# Patient Record
Sex: Male | Born: 1975 | Race: Black or African American | Hispanic: No | Marital: Single | Smoking: Current every day smoker
Health system: Southern US, Community
[De-identification: ages and names within clinical notes are randomized; demographics above are authoritative.]

## PROBLEM LIST (undated history)

## (undated) DIAGNOSIS — I10 Essential (primary) hypertension: Secondary | ICD-10-CM

---

## 2005-03-30 ENCOUNTER — Ambulatory Visit: Payer: Self-pay | Admitting: Internal Medicine

## 2005-04-01 ENCOUNTER — Ambulatory Visit: Payer: Self-pay | Admitting: Internal Medicine

## 2005-04-02 ENCOUNTER — Ambulatory Visit (HOSPITAL_COMMUNITY): Admission: RE | Admit: 2005-04-02 | Discharge: 2005-04-02 | Payer: Self-pay | Admitting: Internal Medicine

## 2005-04-02 ENCOUNTER — Emergency Department (HOSPITAL_COMMUNITY): Admission: EM | Admit: 2005-04-02 | Discharge: 2005-04-02 | Payer: Self-pay | Admitting: Emergency Medicine

## 2005-04-07 ENCOUNTER — Ambulatory Visit: Payer: Self-pay | Admitting: *Deleted

## 2006-01-19 IMAGING — CR DG CERVICAL SPINE COMPLETE 4+V
5 series · 5 of 5 positions shown · non-contrast
Comparison: none

CLINICAL DATA: Neck pain for years.
 CERVICAL SPINE - 5 VIEW:

[w c-spine lat]
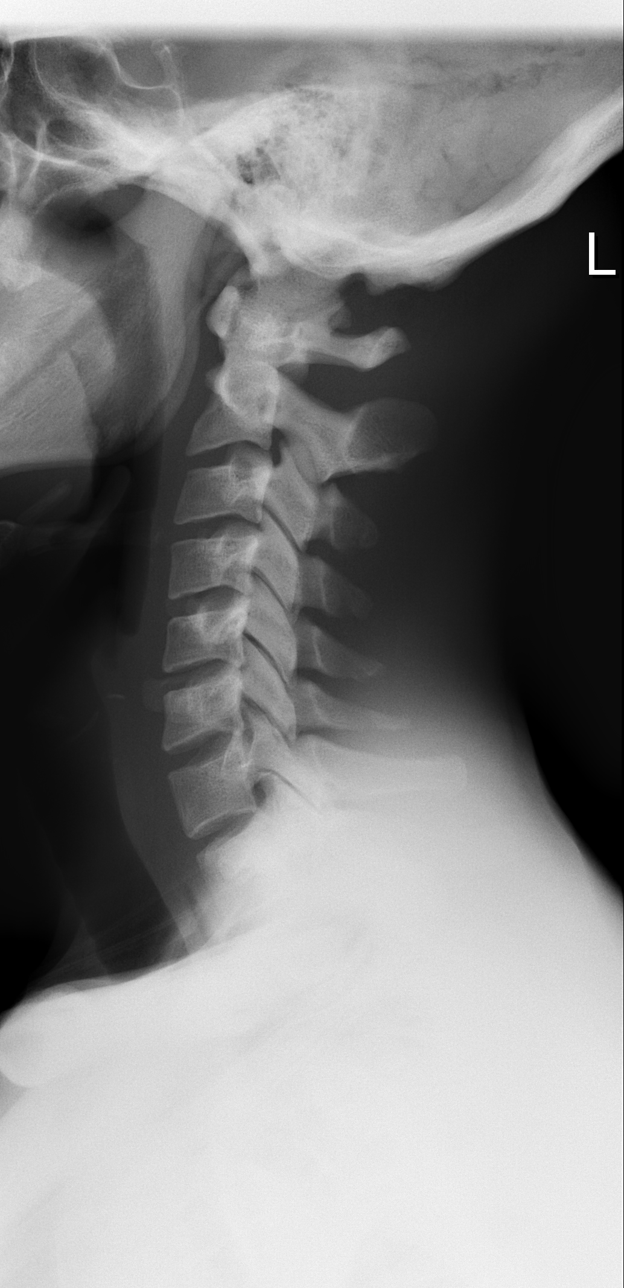

[w c-spine oblique (1 of 2)]
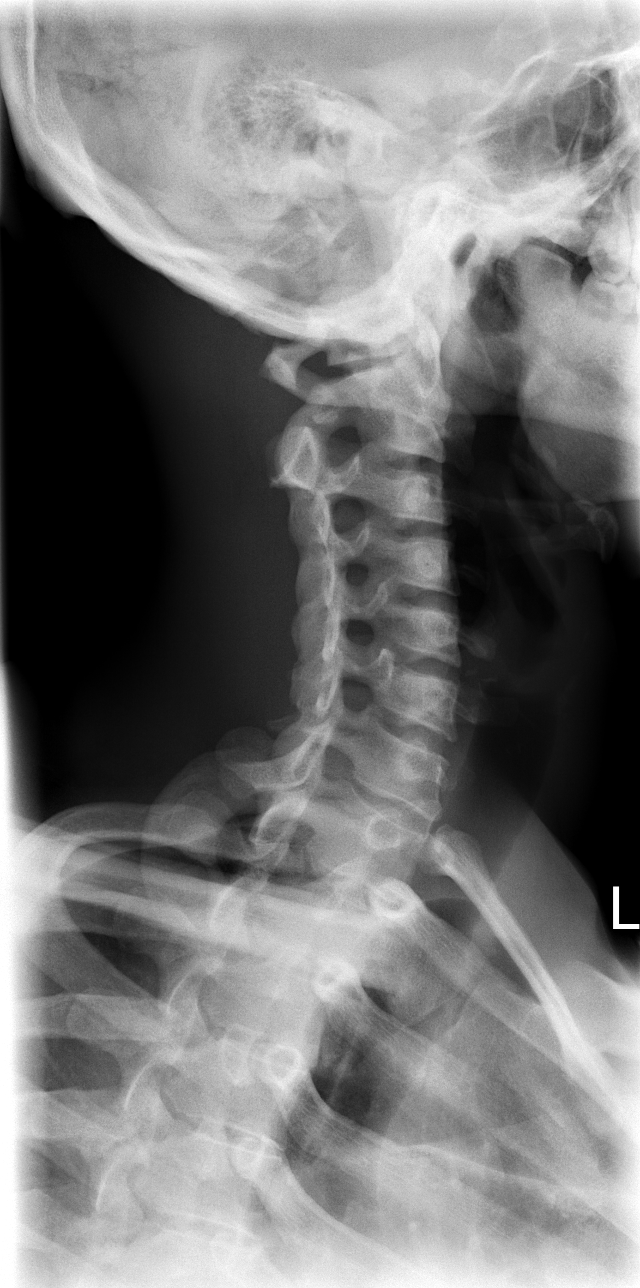

[w c-spine oblique (2 of 2)]
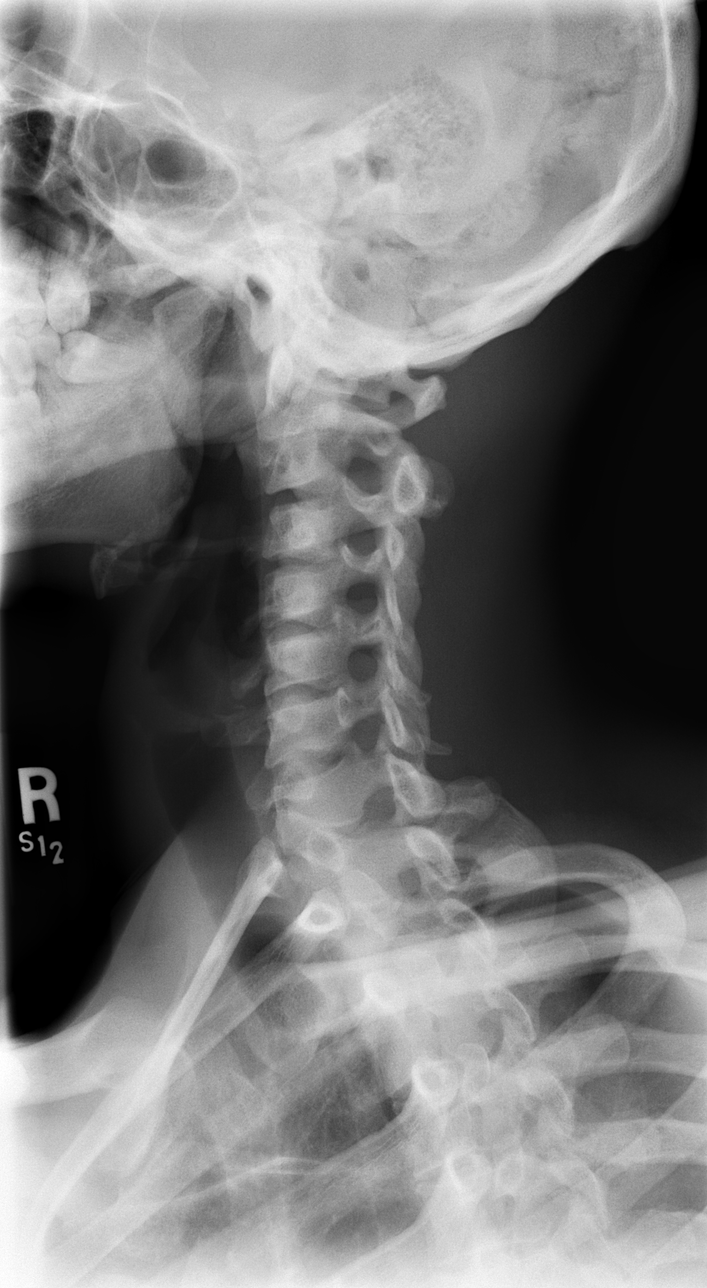

[w c-spine a.p.]
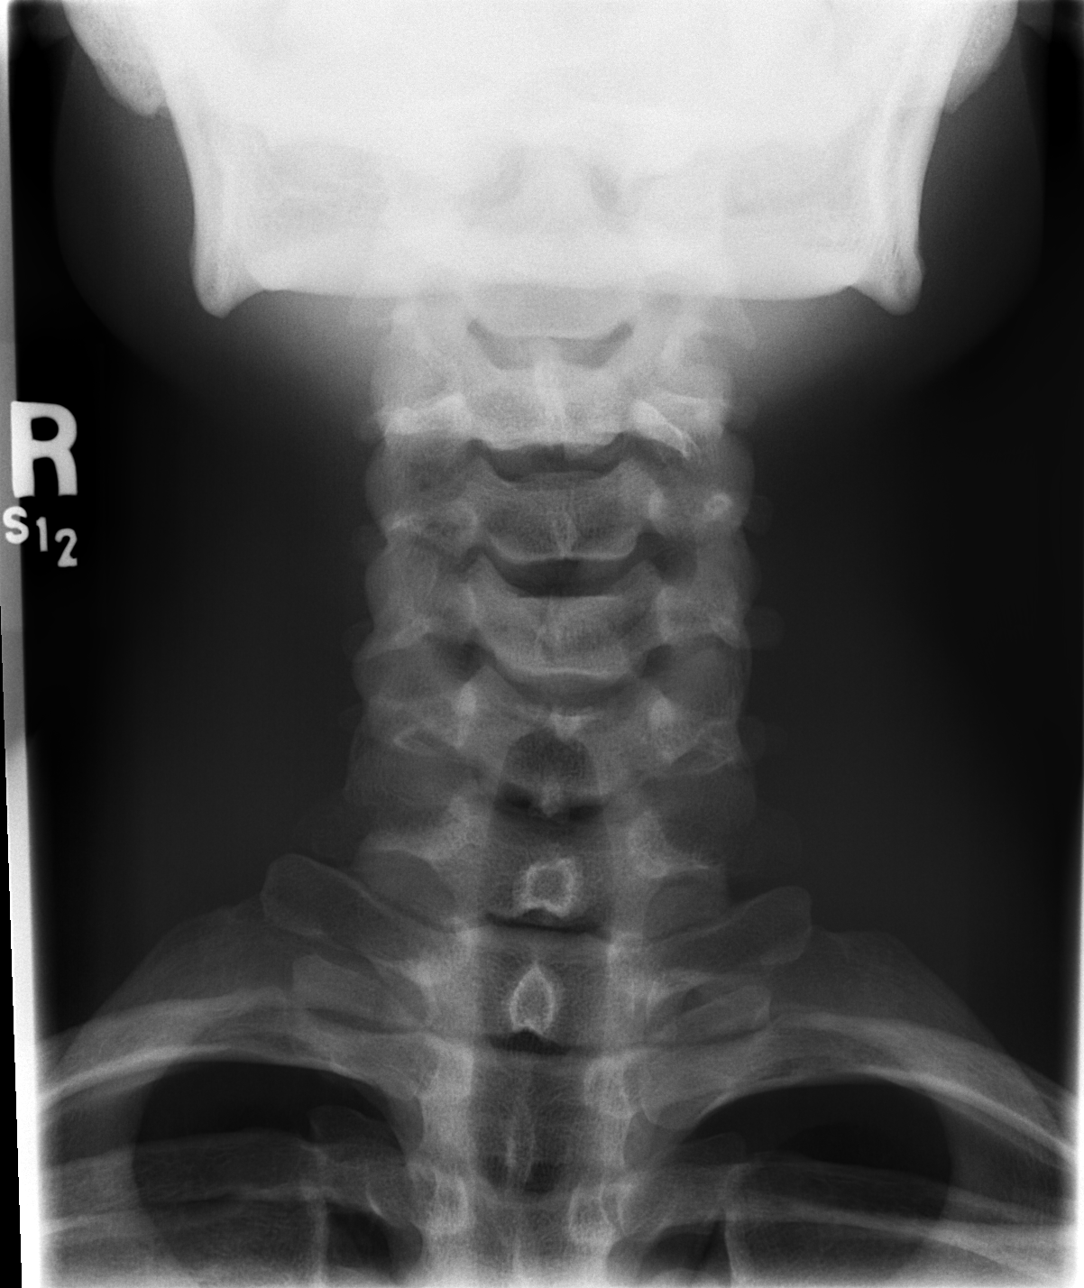

[w c-spine odontoid]
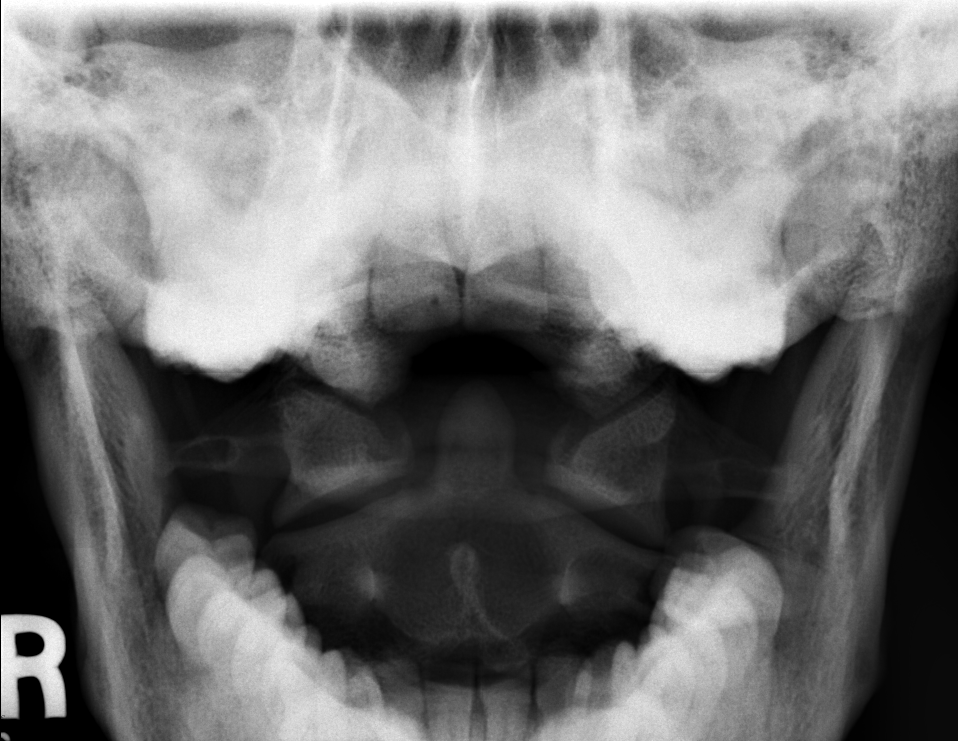

[5 of 5 positions shown; findings below may reference images not displayed]

FINDINGS: There is normal alignment.   No bony abnormality.   Neural foramen are widely patent.
IMPRESSION: No abnormality.

## 2006-10-21 ENCOUNTER — Inpatient Hospital Stay (HOSPITAL_COMMUNITY): Admission: EM | Admit: 2006-10-21 | Discharge: 2006-10-22 | Payer: Self-pay | Admitting: Emergency Medicine

## 2006-10-22 ENCOUNTER — Emergency Department (HOSPITAL_COMMUNITY): Admission: EM | Admit: 2006-10-22 | Discharge: 2006-10-23 | Payer: Self-pay | Admitting: Emergency Medicine
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
## 2021-09-29 ENCOUNTER — Other Ambulatory Visit: Payer: Self-pay

## 2022-02-20 ENCOUNTER — Encounter (HOSPITAL_COMMUNITY): Payer: Self-pay | Admitting: Emergency Medicine

## 2022-02-20 ENCOUNTER — Other Ambulatory Visit: Payer: Self-pay

## 2022-02-20 ENCOUNTER — Emergency Department (HOSPITAL_COMMUNITY)
Admission: EM | Admit: 2022-02-20 | Discharge: 2022-02-20 | Disposition: A | Discharge status: Home / Self Care | Attending: Emergency Medicine | Admitting: Emergency Medicine

## 2022-02-20 DIAGNOSIS — M545 Low back pain, unspecified: Secondary | ICD-10-CM | POA: Diagnosis present

## 2022-02-20 DIAGNOSIS — M25572 Pain in left ankle and joints of left foot: Secondary | ICD-10-CM | POA: Insufficient documentation

## 2022-02-20 DIAGNOSIS — M25571 Pain in right ankle and joints of right foot: Secondary | ICD-10-CM | POA: Diagnosis not present

## 2022-02-20 DIAGNOSIS — G8929 Other chronic pain: Secondary | ICD-10-CM

## 2022-02-20 DIAGNOSIS — M79671 Pain in right foot: Secondary | ICD-10-CM

## 2022-02-20 NOTE — ED Triage Notes (Signed)
BIBA Per EMS: pt did meth around 11am. Has not needed narcan at this time.  120/96 90 hR  12 RR 97% RA When pts dozes off he has decreased resp.
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
## 2022-02-20 NOTE — Discharge Instructions (Signed)
OK to book. Patient seen and evaluated at [facility] Emergency Dept.

## 2022-03-24 ENCOUNTER — Emergency Department (HOSPITAL_COMMUNITY)
Admission: EM | Admit: 2022-03-24 | Discharge: 2022-03-24 | Discharge status: Against Medical Advice | Attending: Emergency Medicine | Admitting: Emergency Medicine

## 2022-03-24 ENCOUNTER — Other Ambulatory Visit: Payer: Self-pay

## 2022-03-24 ENCOUNTER — Encounter (HOSPITAL_COMMUNITY): Payer: Self-pay

## 2022-03-24 DIAGNOSIS — I10 Essential (primary) hypertension: Secondary | ICD-10-CM | POA: Insufficient documentation

## 2022-03-24 DIAGNOSIS — T887XXA Unspecified adverse effect of drug or medicament, initial encounter: Secondary | ICD-10-CM | POA: Insufficient documentation

## 2022-03-24 DIAGNOSIS — Z5321 Procedure and treatment not carried out due to patient leaving prior to being seen by health care provider: Secondary | ICD-10-CM | POA: Insufficient documentation

## 2022-03-24 HISTORY — DX: Essential (primary) hypertension: I10

## 2022-03-24 NOTE — ED Notes (Addendum)
Pt called multiple times no answer 

## 2022-03-24 NOTE — ED Triage Notes (Addendum)
Patient recently released from prison and was on BP meds unsure what they were.  Reports [facility] but they refused to admit him due to his BP.  Patient denies headache chest pain or sob just fatigue. Patient has hx of heroin IV, cocaine, meth and THC use.

## 2022-03-24 NOTE — ED Notes (Signed)
No response from lobby when called to triage room.

## 2022-09-08 ENCOUNTER — Ambulatory Visit (HOSPITAL_COMMUNITY)
Admission: EM | Admit: 2022-09-08 | Discharge: 2022-09-08 | Disposition: A | Discharge status: Home / Self Care | Payer: No Payment, Other | Attending: Psychiatry | Admitting: Psychiatry

## 2022-09-08 DIAGNOSIS — F39 Unspecified mood [affective] disorder: Secondary | ICD-10-CM

## 2022-09-08 MED ORDER — LAMOTRIGINE 25 MG PO TABS: 25.0000 mg | ORAL_TABLET | Freq: Every day | ORAL | 0 refills | Status: AC

## 2022-09-08 MED ORDER — MIRTAZAPINE 15 MG PO TABS: 15.0000 mg | ORAL_TABLET | Freq: Every day | ORAL | 0 refills | Status: AC

## 2022-09-08 MED ORDER — MIRTAZAPINE 15 MG PO TABS: 15.0000 mg | ORAL_TABLET | Freq: Every day | ORAL | 0 refills | Status: DC

## 2022-09-08 MED ORDER — LAMOTRIGINE 25 MG PO TABS: 25.0000 mg | ORAL_TABLET | Freq: Every day | ORAL | 0 refills | Status: DC

## 2022-09-08 NOTE — Progress Notes (Signed)
   09/08/22 1359  [place] (Walk-ins at [facility] only)  How Did You Hear About [place]? Self  What Is the Reason for Your Visit/Call Today? Medicaiton Management; Pt to [place] needing refill on medication he received last from [facility] on 09/02/22.  Pt denies SI, HI,or AVH and Drug/alcohol use.  Pt admits to prior MH diagnosis or precribed medication for symotm managment.  How Long Has This Been Causing You Problems? 1 wk - 1 month  Have You Recently Had Any Thoughts About Hurting Yourself? No  Are You Planning to Commit Suicide/Harm Yourself At This time? No  Have you Recently Had Thoughts About [place]? No  Are You Planning To Harm Someone At This Time? No  Are you currently experiencing any auditory, visual or other hallucinations? No  Have You Used Any Alcohol or Drugs in the Past 24 Hours? No  Do you have any current medical co-morbidities that require immediate attention? No  Clinician description of patient physical appearance/behavior: cooperative  What Do You Feel Would Help You the Most Today? Stress Management;Medication(s)  If access to [facility] Urgent Care was not available, would you have sought care in the Emergency Department? Yes  Determination of Need Routine (7 days)  Options For Referral Medication Management

## 2022-09-08 NOTE — Discharge Instructions (Addendum)
Based on what you have shared, a list of resources for outpatient therapy and psychiatry is provided below to get you started back on treatment.  It is imperative that you follow through with treatment within 5-7 days from the day of discharge to prevent any further risk to your safety or mental well-being.  You are not limited to the list provided.  In case of an urgent crisis, you may contact the Mobile Crisis Unit with [facility] at [phone].        Outpatient Services for Therapy and Medication Management for [facility] [place], [place], [address] [phone] phone  New Patient Assessment/Therapy Walk-ins Monday and Wednesday: 8am until slots are full. Every 1st and 2nd Friday: 1pm - 5pm  NO ASSESSMENT/THERAPY WALK-INS ON [place]  New Patient Psychiatry/Medication Management Walk-ins Monday-Friday: 8am-11am  For all walk-ins, we ask that you arrive by 7:30am because patient will be seen in the order of arrival.  Availability is limited; therefore, you may not be seen on the same day that you walk-in.  Our goal is to serve and meet the needs of our community to the best of our ability.   [facility] [address], [place], [place], [address] [phone] phone  [facility], [place], [place], [address] [phone] phone [phone] phone ([address], AmeriHealth, Anthem/Elevance, BCBS, [place], [place], ComPsych, Healthy [place], [place], [place], [place], [place], Southern Company, [place], Out of Network)  [employer], [address] [place], [place], [address] [phone] phone ([place], Anthem/Elevance, [employer] Options/Carelon, [place], [place], [place], [place], [place], [employer], [place], [place], [place], [facility])  [facility] [address] [place], [place], [address] [phone] phone (Medicaid, ask about other insurance)  [facility] [address] [place], [place], [address] [phone] phone [phone] fax ([address], [place] , [employer], [place], [place], UHC, Pilgrim[employer] Pride, Self-Pay)  [facility] [address] [place], [place], [address] [phone] phone ([address] Anthem/Elevance, [place], [place], [place], [place], [place], [employer] Company, [place], [place], [place], [facility])  [place] - 6-8 MONTH WAIT FOR THERAPY; [facility]NAG[facility] [address], [place], [place], [address] [phone] phone ([address] [place], [place], [place], [place], Friday Health Plans, [place], [place], [place], [place], [place], [place], Tricare, UHC, The [employer] Companies, [place])  [facility] [address] [place], [place], [address] [phone] phone  [place] [address], [place], [place], [address] [phone] phone  [facility] [address], [place], [place], [address] [phone] phone  [facility] [place] [address], [place], [address] [phone] phone  [facility], [place] [address], [place], [address] [phone] phone  [facility] [address] [address] [place], [place], [address] [phone] phone [phone] fax  [facility] [address] [place], [place], [address] [phone] phone [phone] fax  [facility] [phone], [place], [address] [phone] phone  Jinny Blossom [address] [place], [place], [address]  [phone] phone  [facility] [place]  (Adults Only) [address] [place], [place],  [address]  [phone] phone [phone] fax    [facility]  [place], [place], [address] [phone] phone  Offers food and emergency or transitional housing to men, women, or families in need. Clients participate in programs and workshops developed to promote self-sufficiency and personal development.Call or walk in. Applications are accepted Monday, Wednesday, and Friday by appointment only. Need photo ID and proof of income.  [facility] [address], [place], [place] [address] [phone] Population served: Adult men & women (4 years old and older, able to perform activities for daily living) Documents required: Valid ID & [place] [address] [place], [place]  [address] [phone] Population served: Families with children  [place] [address], [place], [place]  [address] [phone] Population served: Single women 18+ without dependents Documents required:  [place] Card  [facility] [place] [address], [place], [place]  [address] [phone] Population served: Male veterans 18+ with substance abuse/mental health issues Eligibility: By referral only  [facility] [address] [place], [place] [address] [phone] Population served: Males 18+ Documents required: Valid ID & Social Security Card  Room at [employer] of the [place]. [address] [place] [address] [phone] or [phone] Population served: Pregnant women with or without children  Documents required: Valid ID & Social Security [facility] [address] [place], [place] [address] [phone] Population Served: Families with children  The [place] [facility] [address] [place], [place] [address] [phone] Population  served: Men 18+, preference for disabled and/or veterans Eligibility: By referral only  [facility]) - Emergency Family [facility]ter [place], [place], [place] [address] [phone] or [phone] Population served: Families with children.    WOMEN ONLY  The Shelter serves up to 20 women each night. Open from December 11th through the end of March in the evenings from 5:30 pm until 7:30 am, the Shelter provides a hot evening meal, shower and sleeping facilities, and food for the next day. Secure parking is available beside the building.     Shelter Address   Directions [facility] [place], [place]  If you are in need of housing through the shelter, contact the [employer] at [phone].

## 2022-09-08 NOTE — ED Provider Notes (Signed)
Behavioral Health Urgent Care Medical Screening Exam  Patient Name: Kyle Wright MRN: [id number] Date of Evaluation: 09/08/22 Chief Complaint:  "I need my medications refilled" Diagnosis:  Final diagnoses:  Mood disorder ([place])    History of Present illness: Kyle Wright is a 47 y.o. malepatient presented to [facility] as a walk in  accompanied by his brother Kyle Wright with complaints of, "I need my medications refilled".  Kyle Wright, 47 y.o., male patient seen face to face by this provider, consulted with Dr. Dwyane Dee; and chart reviewed on 09/08/22.  Chart review is limited.  Patient reports that he has been diagnosed with GAD, PTSD, MDD, and bipolar disorder.  He has had a history of at least 1 inpatient admission that was out[place]of[place]state years ago.  He is homeless and unemployed.   On evaluation Kyle Wright reports he has been in jail for the past 3 months.  While he was in prison he was prescribed Lamictal 25 mg daily and Remeron 15 mg nightly.  He was released from prison this past Wednesday.  He has not taken any medication since that time.  He is noticing a change in his mood.  Reports he is becoming more irritable and he is having difficulty sleeping.  He presents today requesting medication refills.  During evaluation Kyle Wright is observed sitting in the lobby with his brother whom he gives permission to be present during the assessment.  He is alert/oriented x 4, cooperative, and attentive.  He is fairly groomed and makes good eye contact.  He has normal speech and behavior.  He endorses an increase in his anxiety and depression since last Wednesday.  He feels more irritable, decreased focus, decreased motivation, and decreased sleep.  He is denying SI/HI/AVH.  He does not appear psychotic or manic.  He does not appear to be responding to internal/external stimuli.   Discussed outpatient psychiatric resources for medication management and therapy.  Provided open access walk-in hours for  [facility] behavioral health outpatient services on the second floor.  Provided printed prescription for a 7-day supply of Lamictal 25 mg daily and Remeron 15 mg nightly.  Educated on medications, side effects, and how to take medications.  Patient verbalized understanding.     [place] ED from 09/08/2022 in [facility] ED from 03/24/2022 in [facility] Emergency Department at [facility] ED from 02/20/2022 in [facility] Emergency Department at [place] No Risk No Risk No Risk       Psychiatric Specialty Exam  Presentation  General Appearance:Casual  Eye Contact:Good  Speech:Clear and Coherent; Normal Rate  Speech Volume:Normal  Handedness:Right   Mood and Affect  Mood:Anxious; Depressed  Affect:Congruent   Thought Process  Thought Processes:Coherent  Descriptions of Associations:Intact  Orientation:Full (Time, Place and Person)  Thought Content:Logical    Hallucinations:None  Ideas of Reference:None  Suicidal Thoughts:No  Homicidal Thoughts:No   Sensorium  Memory:Immediate Good; Recent Good; Remote Good  Judgment:Good  Insight:Good   Executive Functions  Concentration:Good  Attention Span:Good  Recall:Good  Fund of Knowledge:Good  Language:Good   Psychomotor Activity  Psychomotor Activity:Normal   Assets  Assets:Communication Skills; Desire for Improvement; Physical Health; Resilience; Social Support   Sleep  Sleep:Good  Number of hours: No data recorded  Physical Exam: Physical Exam Vitals and nursing note reviewed.  Constitutional:      General: He is not in acute distress.    Appearance: Normal appearance. He is well-developed.  HENT:  Head: Normocephalic and atraumatic.  Cardiovascular:     Rate and Rhythm: Normal rate.  Pulmonary:     Effort: Pulmonary effort is normal. No respiratory distress.     Breath sounds: Normal breath sounds.   Musculoskeletal:        General: Normal range of motion.     Cervical back: Normal range of motion.  Skin:    Coloration: Skin is not jaundiced or pale.  Neurological:     Mental Status: He is alert and oriented to person, place, and time.  Psychiatric:        Attention and Perception: Attention and perception normal.        Mood and Affect: Mood is anxious and depressed.        Speech: Speech normal.        Behavior: Behavior normal. Behavior is cooperative.        Thought Content: Thought content normal.        Cognition and Memory: Cognition normal.        Judgment: Judgment normal.    Review of Systems  Constitutional: Negative.   HENT: Negative.    Eyes: Negative.   Respiratory: Negative.    Cardiovascular: Negative.   Musculoskeletal: Negative.   Skin: Negative.   Neurological: Negative.   Psychiatric/Behavioral:  Positive for depression. The patient is nervous/anxious.    There were no vitals taken for this visit. There is no height or weight on file to calculate BMI.  Musculoskeletal: Strength & Muscle Tone: within normal limits Gait & Station: normal Patient leans: N/A   [place] [facility] Discharge Disposition for Follow up and Recommendations: Based on my evaluation the patient does not appear to have an emergency medical condition and can be discharged with resources and follow up care in outpatient services for Medication Management and Individual Therapy  Discharge patient  Discussed outpatient psychiatric resources for medication management and therapy.  Provided open access walk-in hours for [facility] behavioral health outpatient services on the second floor.  Provided printed prescription for a 7-day supply of Lamictal 25 mg daily and Remeron 15 mg nightly.       Revonda Humphrey, NP 09/08/2022, 5:51 PM

## 2022-09-18 ENCOUNTER — Emergency Department (HOSPITAL_COMMUNITY): Payer: Medicaid Other

## 2022-09-18 ENCOUNTER — Encounter (HOSPITAL_COMMUNITY): Payer: Self-pay | Admitting: Emergency Medicine

## 2022-09-18 ENCOUNTER — Other Ambulatory Visit: Payer: Self-pay

## 2022-09-18 ENCOUNTER — Emergency Department (HOSPITAL_COMMUNITY)
Admission: EM | Admit: 2022-09-18 | Discharge: 2022-09-19 | Disposition: A | Discharge status: Home / Self Care | Payer: Medicaid Other | Attending: Emergency Medicine | Admitting: Emergency Medicine

## 2022-09-18 DIAGNOSIS — S61451A Open bite of right hand, initial encounter: Secondary | ICD-10-CM | POA: Insufficient documentation

## 2022-09-18 DIAGNOSIS — Z23 Encounter for immunization: Secondary | ICD-10-CM | POA: Diagnosis not present

## 2022-09-18 DIAGNOSIS — S6991XA Unspecified injury of right wrist, hand and finger(s), initial encounter: Secondary | ICD-10-CM | POA: Diagnosis present

## 2022-09-18 DIAGNOSIS — B349 Viral infection, unspecified: Secondary | ICD-10-CM

## 2022-09-18 DIAGNOSIS — U071 COVID-19: Secondary | ICD-10-CM | POA: Diagnosis not present

## 2022-09-18 DIAGNOSIS — I10 Essential (primary) hypertension: Secondary | ICD-10-CM | POA: Insufficient documentation

## 2022-09-18 MED ORDER — ACETAMINOPHEN 325 MG PO TABS
650.0000 mg | ORAL_TABLET | Freq: Once | ORAL | Status: AC | PRN
  Administered 2022-09-18: 650 mg via ORAL
  Filled 2022-09-18: qty 2

## 2022-09-18 MED ORDER — TETANUS-DIPHTH-ACELL PERTUSSIS 5-2.5-18.5 LF-MCG/0.5 IM SUSY
0.5000 mL | PREFILLED_SYRINGE | Freq: Once | INTRAMUSCULAR | Status: AC
  Administered 2022-09-18: 0.5 mL via INTRAMUSCULAR
  Filled 2022-09-18: qty 0.5

## 2022-09-18 MED ORDER — IBUPROFEN 400 MG PO TABS
600.0000 mg | ORAL_TABLET | Freq: Once | ORAL | Status: AC
  Administered 2022-09-18: 600 mg via ORAL
  Filled 2022-09-18: qty 1

## 2022-09-18 MED ORDER — AMOXICILLIN-POT CLAVULANATE 875-125 MG PO TABS
1.0000 | ORAL_TABLET | Freq: Once | ORAL | Status: AC
  Administered 2022-09-18: 1 via ORAL
  Filled 2022-09-18: qty 1

## 2022-09-18 MED ORDER — BACITRACIN ZINC 500 UNIT/GM EX OINT
TOPICAL_OINTMENT | Freq: Once | CUTANEOUS | Status: AC
  Administered 2022-09-18: 1 via TOPICAL
  Filled 2022-09-18: qty 0.9
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
## 2022-09-18 NOTE — Discharge Instructions (Addendum)
Your x-ray showed no fracture or dislocation of your hand and your x-ray of your chest showed no pneumonia.  You need to take the antibiotics as prescribed and do not miss any doses.  Make sure to clean your hand daily with soap and water and apply over-the-counter antibiotics or Neosporin to it daily until fully healed.  You can follow-up with your COVID test in your MyChart.  Make an appointment with the hand surgeon listed in your discharge paperwork.  Come back if you have any increased redness, swelling, continued high fevers, streaking up your arm, pus coming out of your wound, or any other symptoms concerning to you.

## 2022-09-19 LAB — RESP PANEL BY RT-PCR (RSV, FLU A&B, COVID)  RVPGX2
Influenza A by PCR: NEGATIVE
Influenza B by PCR: NEGATIVE
Resp Syncytial Virus by PCR: NEGATIVE
SARS Coronavirus 2 by RT PCR: POSITIVE — AB

## 2022-09-19 MED ORDER — NAPROXEN 500 MG PO TABS: 500.0000 mg | ORAL_TABLET | Freq: Two times a day (BID) | ORAL | 0 refills | Status: AC

## 2022-09-19 MED ORDER — AMOXICILLIN-POT CLAVULANATE 875-125 MG PO TABS: 1.0000 | ORAL_TABLET | Freq: Two times a day (BID) | ORAL | 0 refills | Status: AC

## 2022-12-22 ENCOUNTER — Encounter (HOSPITAL_COMMUNITY): Payer: Self-pay

## 2022-12-22 ENCOUNTER — Emergency Department (HOSPITAL_COMMUNITY)
Admission: EM | Admit: 2022-12-22 | Discharge: 2022-12-22 | Discharge status: Against Medical Advice | Payer: Medicaid Other | Attending: Emergency Medicine | Admitting: Emergency Medicine

## 2022-12-22 DIAGNOSIS — Z5329 Procedure and treatment not carried out because of patient's decision for other reasons: Secondary | ICD-10-CM | POA: Insufficient documentation

## 2022-12-22 DIAGNOSIS — F1999 Other psychoactive substance use, unspecified with unspecified psychoactive substance-induced disorder: Secondary | ICD-10-CM | POA: Diagnosis present

## 2022-12-22 DIAGNOSIS — F1990 Other psychoactive substance use, unspecified, uncomplicated: Secondary | ICD-10-CM

## 2022-12-22 MED ORDER — CLONIDINE HCL 0.1 MG PO TABS: 0.1000 mg | ORAL_TABLET | Freq: Once | ORAL | Status: DC

## 2022-12-22 NOTE — ED Triage Notes (Signed)
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  Last use earlier today.

## 2022-12-22 NOTE — ED Notes (Signed)
Pt no not in hallway bed. Unable to locate at this time.

## 2022-12-22 NOTE — ED Provider Notes (Signed)
[place] EMERGENCY DEPARTMENT AT [facility] Provider Note   CSN: [id number] Arrival date & time: 12/22/22  1033     History  Chief Complaint  Patient presents with   Detox    Kyle Wright is a 47 y.o. male, no pertinent past medical history, who presents to the ED secondary to wanting help for heroin and meth withdrawal.  He states he last used at 8 AM today, and states he would like to stop using.  Denies any fevers, chills, chest pain, shortness of breath.  Has not been [place] lately.  She is asking for help.  Is been 3+ months since he has been sober.  Denies any frequent alcohol use.  Denies any SI, HI, AVH.    Home Medications Prior to Admission medications   Medication Sig Start Date End Date Taking? Authorizing Provider  amoxicillin-clavulanate (AUGMENTIN) 875-125 MG tablet Take 1 tablet by mouth every 12 (twelve) hours. 09/19/22   Mardene Sayer, MD  lamoTRIgine (LAMICTAL) 25 MG tablet Take 1 tablet (25 mg total) by mouth daily. 09/08/22   Ardis Hughs, NP  mirtazapine (REMERON) 15 MG tablet Take 1 tablet (15 mg total) by mouth at bedtime. 09/08/22 09/08/23  Ardis Hughs, NP  naproxen (NAPROSYN) 500 MG tablet Take 1 tablet (500 mg total) by mouth 2 (two) times daily. 09/19/22   Mardene Sayer, MD      Allergies    Shellfish allergy    Review of Systems   Review of Systems  Constitutional:  Negative for fever.  Respiratory:  Negative for shortness of breath.     Physical Exam Updated Vital Signs BP (!) 178/124 (BP Location: Right Arm) Comment: Pt noncomplaint w/ HTN medication.  Pulse 70   Temp 99.4 F (37.4 C) (Oral)   Resp 16   Ht 5\' 6"  (1.676 m)   Wt 77.1 kg   SpO2 100%   BMI 27.44 kg/m  Physical Exam Vitals and nursing note reviewed.  Constitutional:      General: He is not in acute distress.    Appearance: He is well-developed.  HENT:     Head: Normocephalic and atraumatic.  Eyes:     Conjunctiva/sclera: Conjunctivae  normal.  Cardiovascular:     Rate and Rhythm: Normal rate and regular rhythm.     Heart sounds: No murmur heard. Pulmonary:     Effort: Pulmonary effort is normal. No respiratory distress.     Breath sounds: Normal breath sounds.  Abdominal:     Palpations: Abdomen is soft.     Tenderness: There is no abdominal tenderness.  Musculoskeletal:        General: No swelling.     Cervical back: Neck supple.  Skin:    General: Skin is warm and dry.     Capillary Refill: Capillary refill takes less than 2 seconds.  Neurological:     Mental Status: He is alert.  Psychiatric:        Mood and Affect: Mood normal.     ED Results / Procedures / Treatments   Labs (all labs ordered are listed, but only abnormal results are displayed) Labs Reviewed  SARS CORONAVIRUS 2 BY RT PCR  COMPREHENSIVE METABOLIC PANEL  CBC WITH DIFFERENTIAL/PLATELET  RAPID URINE DRUG SCREEN, HOSP PERFORMED  ETHANOL    EKG None  Radiology No results found.  Procedures Procedures    Medications Ordered in ED Medications  cloNIDine (CATAPRES) tablet 0.1 mg (has no administration in time range)  ED Course/ Medical Decision Making/ A&P                             Medical Decision Making Patient is a 47 year old male, here for detox, for heroin and meth.  Last used earlier today.  We will obtain labs, to medically clear, and then likely send to be [facility].  On reeval, patient has eloped per nursing staff  Amount and/or Complexity of Data Reviewed Labs: ordered.  Risk Prescription drug management.    Final Clinical Impression(s) / ED Diagnoses Final diagnoses:  Substance use disorder    Rx / DC Orders ED Discharge Orders     None         Pete Pelt, PA 12/22/22 1535    Jacalyn Lefevre, MD 12/25/22 1513
